# Patient Record
Sex: Male | Born: 1985
Health system: Southern US, Community
[De-identification: ages and names within clinical notes are randomized; demographics above are authoritative.]

---
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
## 2005-10-20 ENCOUNTER — Emergency Department (HOSPITAL_COMMUNITY): Admission: EM | Admit: 2005-10-20 | Discharge: 2005-10-20 | Payer: Self-pay | Admitting: Emergency Medicine

## 2007-11-14 ENCOUNTER — Encounter: Admission: RE | Admit: 2007-11-14 | Discharge: 2007-11-14 | Payer: Self-pay | Admitting: Specialist

## 2009-04-11 ENCOUNTER — Emergency Department (HOSPITAL_COMMUNITY): Admission: EM | Admit: 2009-04-11 | Discharge: 2009-04-11 | Payer: Self-pay | Admitting: Emergency Medicine

## 2010-06-16 ENCOUNTER — Emergency Department (HOSPITAL_COMMUNITY)
Admission: EM | Admit: 2010-06-16 | Discharge: 2010-06-16 | Disposition: A | Discharge status: Home / Self Care | Payer: BC Managed Care – PPO | Attending: Emergency Medicine | Admitting: Emergency Medicine

## 2010-06-16 ENCOUNTER — Emergency Department (HOSPITAL_COMMUNITY): Payer: BC Managed Care – PPO

## 2010-06-16 DIAGNOSIS — R51 Headache: Secondary | ICD-10-CM | POA: Insufficient documentation

## 2010-06-16 DIAGNOSIS — R4789 Other speech disturbances: Secondary | ICD-10-CM | POA: Insufficient documentation

## 2010-06-16 DIAGNOSIS — R209 Unspecified disturbances of skin sensation: Secondary | ICD-10-CM | POA: Insufficient documentation

## 2010-07-18 IMAGING — RF Imaging study
1 series · 1 of 1 positions shown · IV contrast (magnevist)
Comparison: none

CLINICAL DATA: I left shoulder pain status post multiple
dislocations.

Fluoroscopy Time: 23 seconds
LEFT SHOULDER INJECTION UNDER FLUOROSCOPY
TECHNIQUE: An appropriate skin entrance site was determined. The
site was marked, prepped with Betadine, draped in the usual sterile
fashion, and infiltrated locally with buffered Lidocaine.  22 gauge
spinal needle was advanced to the superomedial margin of the
humeral head under intermittent fluoroscopy.  1 ml of Lidocaine
injected easily.  A mixture of 0.1 ml Magnevist 10 ml of dilute
Hary Eko 60 was then used to opacify the left shoulder capsule.  No
immediate complication.

[Series 1: (hospital) · 1 of 1 slices shown]
[im 1/1]
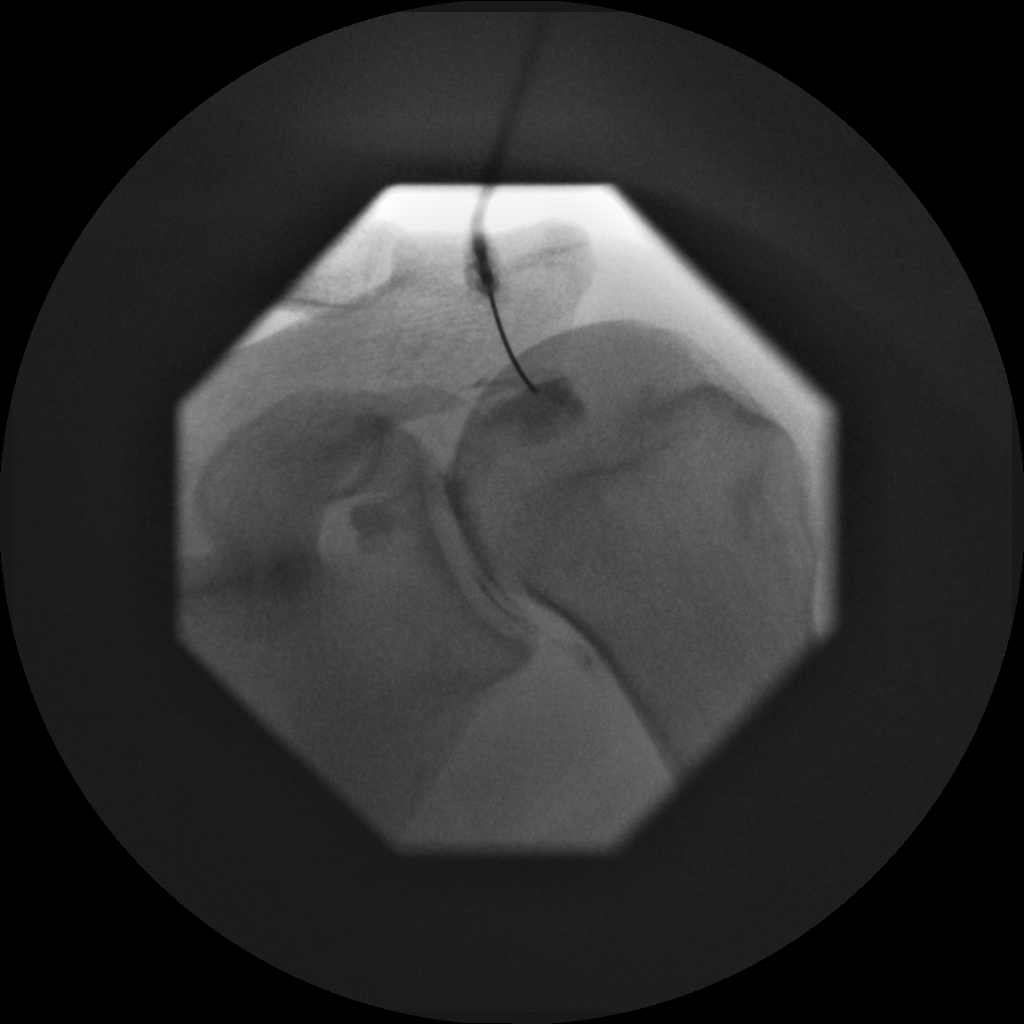

[1 of 1 positions shown; findings below may reference images not displayed]

IMPRESSION: Technically successful left shoulder injection for MRI.

## 2011-12-14 IMAGING — CR DG KNEE 1-2V PORT*L*
2 series · 2 of 2 positions shown · non-contrast
Comparison: None.

CLINICAL DATA: Knee pain.  Deformity.

PORTABLE LEFT KNEE - 1-2 VIEW

[ap/obl knee (1 of 2)]
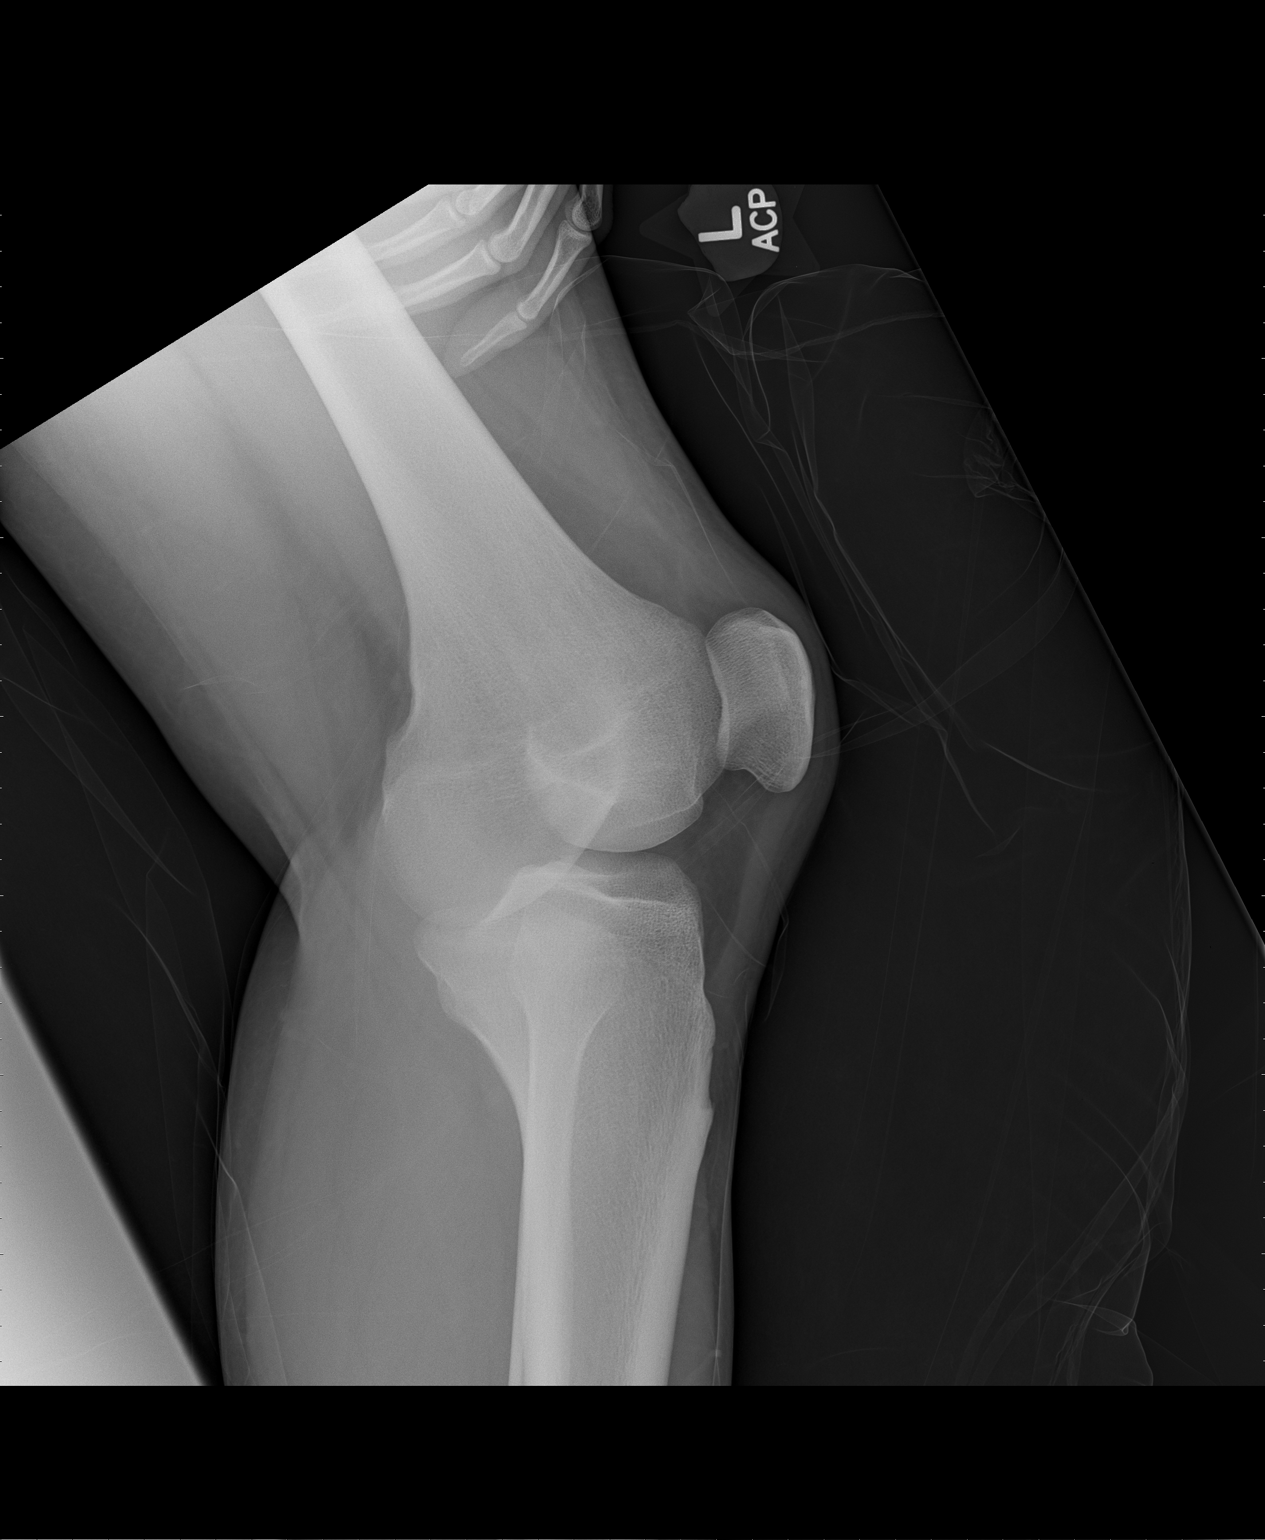

[ap/obl knee (2 of 2)]
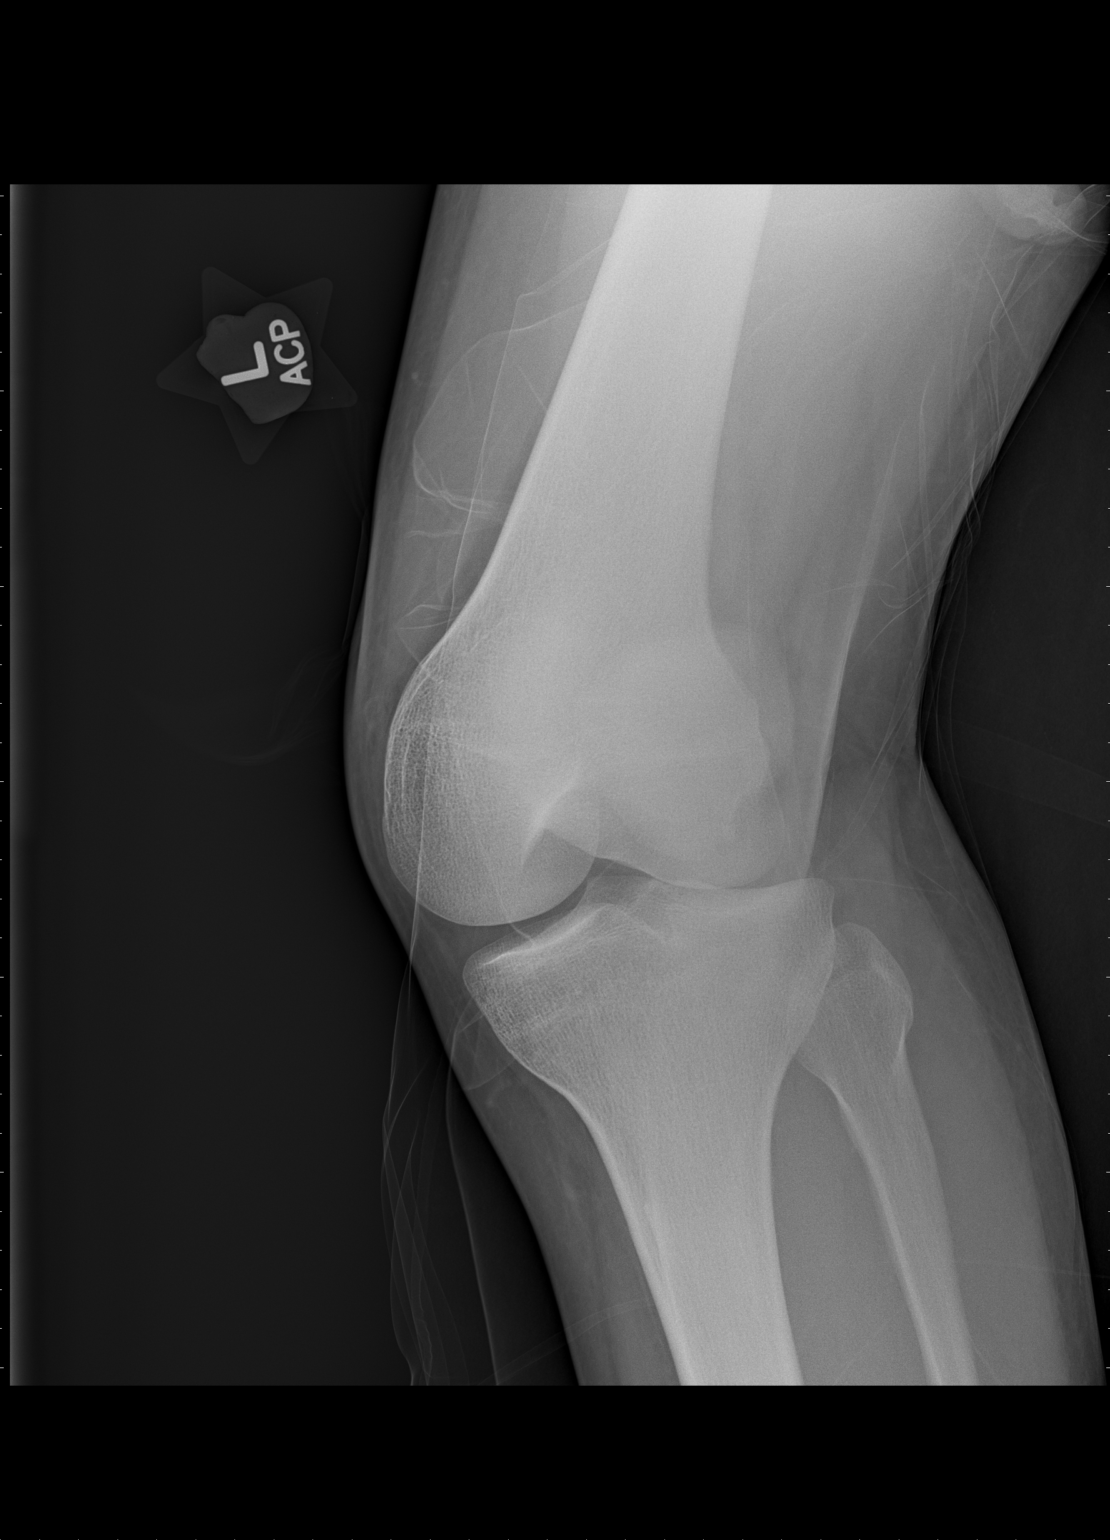

[2 of 2 positions shown; findings below may reference images not displayed]

FINDINGS: Nonstandard views are submitted for interpretation.  The
knee appears located.  No fracture is identified.  Probable small
effusion.  Valgus deformity of the knee is present.  If the patient
can tolerate, repeat examination and standard AP and lateral
projections may prove useful.
IMPRESSION: Nonstandard projections with valgus deformity of the knee.  No
fracture is identified.  Probable small effusion.

## 2015-10-20 DIAGNOSIS — M25521 Pain in right elbow: Secondary | ICD-10-CM | POA: Diagnosis not present

## 2015-10-20 DIAGNOSIS — M9907 Segmental and somatic dysfunction of upper extremity: Secondary | ICD-10-CM | POA: Diagnosis not present

## 2015-10-20 DIAGNOSIS — M542 Cervicalgia: Secondary | ICD-10-CM | POA: Diagnosis not present

## 2015-10-20 DIAGNOSIS — M530 Cervicocranial syndrome: Secondary | ICD-10-CM | POA: Diagnosis not present

## 2015-11-16 DIAGNOSIS — M25521 Pain in right elbow: Secondary | ICD-10-CM | POA: Diagnosis not present

## 2015-11-16 DIAGNOSIS — M9907 Segmental and somatic dysfunction of upper extremity: Secondary | ICD-10-CM | POA: Diagnosis not present

## 2015-11-16 DIAGNOSIS — M530 Cervicocranial syndrome: Secondary | ICD-10-CM | POA: Diagnosis not present

## 2015-11-16 DIAGNOSIS — M542 Cervicalgia: Secondary | ICD-10-CM | POA: Diagnosis not present

## 2015-12-16 DIAGNOSIS — M9907 Segmental and somatic dysfunction of upper extremity: Secondary | ICD-10-CM | POA: Diagnosis not present

## 2015-12-16 DIAGNOSIS — M25521 Pain in right elbow: Secondary | ICD-10-CM | POA: Diagnosis not present

## 2015-12-16 DIAGNOSIS — M530 Cervicocranial syndrome: Secondary | ICD-10-CM | POA: Diagnosis not present

## 2015-12-16 DIAGNOSIS — M542 Cervicalgia: Secondary | ICD-10-CM | POA: Diagnosis not present

## 2016-01-18 DIAGNOSIS — M25521 Pain in right elbow: Secondary | ICD-10-CM | POA: Diagnosis not present

## 2016-01-18 DIAGNOSIS — M9907 Segmental and somatic dysfunction of upper extremity: Secondary | ICD-10-CM | POA: Diagnosis not present

## 2016-01-18 DIAGNOSIS — M530 Cervicocranial syndrome: Secondary | ICD-10-CM | POA: Diagnosis not present

## 2016-01-18 DIAGNOSIS — M542 Cervicalgia: Secondary | ICD-10-CM | POA: Diagnosis not present

## 2016-01-28 DIAGNOSIS — M25521 Pain in right elbow: Secondary | ICD-10-CM | POA: Diagnosis not present

## 2016-01-28 DIAGNOSIS — M530 Cervicocranial syndrome: Secondary | ICD-10-CM | POA: Diagnosis not present

## 2016-01-28 DIAGNOSIS — M542 Cervicalgia: Secondary | ICD-10-CM | POA: Diagnosis not present

## 2016-01-28 DIAGNOSIS — M9907 Segmental and somatic dysfunction of upper extremity: Secondary | ICD-10-CM | POA: Diagnosis not present

## 2016-02-18 DIAGNOSIS — M530 Cervicocranial syndrome: Secondary | ICD-10-CM | POA: Diagnosis not present

## 2016-02-18 DIAGNOSIS — M9907 Segmental and somatic dysfunction of upper extremity: Secondary | ICD-10-CM | POA: Diagnosis not present

## 2016-02-18 DIAGNOSIS — M25521 Pain in right elbow: Secondary | ICD-10-CM | POA: Diagnosis not present

## 2016-02-18 DIAGNOSIS — M542 Cervicalgia: Secondary | ICD-10-CM | POA: Diagnosis not present

## 2016-03-18 DIAGNOSIS — M9907 Segmental and somatic dysfunction of upper extremity: Secondary | ICD-10-CM | POA: Diagnosis not present

## 2016-03-18 DIAGNOSIS — M530 Cervicocranial syndrome: Secondary | ICD-10-CM | POA: Diagnosis not present

## 2016-03-18 DIAGNOSIS — M25521 Pain in right elbow: Secondary | ICD-10-CM | POA: Diagnosis not present

## 2016-03-18 DIAGNOSIS — M542 Cervicalgia: Secondary | ICD-10-CM | POA: Diagnosis not present

## 2016-04-20 DIAGNOSIS — M9907 Segmental and somatic dysfunction of upper extremity: Secondary | ICD-10-CM | POA: Diagnosis not present

## 2016-04-20 DIAGNOSIS — M530 Cervicocranial syndrome: Secondary | ICD-10-CM | POA: Diagnosis not present

## 2016-04-20 DIAGNOSIS — M542 Cervicalgia: Secondary | ICD-10-CM | POA: Diagnosis not present

## 2016-04-20 DIAGNOSIS — M25521 Pain in right elbow: Secondary | ICD-10-CM | POA: Diagnosis not present

## 2016-05-18 DIAGNOSIS — M9907 Segmental and somatic dysfunction of upper extremity: Secondary | ICD-10-CM | POA: Diagnosis not present

## 2016-05-18 DIAGNOSIS — M530 Cervicocranial syndrome: Secondary | ICD-10-CM | POA: Diagnosis not present

## 2016-05-18 DIAGNOSIS — M542 Cervicalgia: Secondary | ICD-10-CM | POA: Diagnosis not present

## 2016-05-18 DIAGNOSIS — M25521 Pain in right elbow: Secondary | ICD-10-CM | POA: Diagnosis not present

## 2016-06-17 DIAGNOSIS — M530 Cervicocranial syndrome: Secondary | ICD-10-CM | POA: Diagnosis not present

## 2016-06-17 DIAGNOSIS — M9907 Segmental and somatic dysfunction of upper extremity: Secondary | ICD-10-CM | POA: Diagnosis not present

## 2016-06-17 DIAGNOSIS — M25521 Pain in right elbow: Secondary | ICD-10-CM | POA: Diagnosis not present

## 2016-06-17 DIAGNOSIS — M542 Cervicalgia: Secondary | ICD-10-CM | POA: Diagnosis not present

## 2016-07-15 DIAGNOSIS — M530 Cervicocranial syndrome: Secondary | ICD-10-CM | POA: Diagnosis not present

## 2016-07-15 DIAGNOSIS — M542 Cervicalgia: Secondary | ICD-10-CM | POA: Diagnosis not present

## 2016-07-15 DIAGNOSIS — M25521 Pain in right elbow: Secondary | ICD-10-CM | POA: Diagnosis not present

## 2016-07-15 DIAGNOSIS — M9907 Segmental and somatic dysfunction of upper extremity: Secondary | ICD-10-CM | POA: Diagnosis not present

## 2016-07-28 DIAGNOSIS — M530 Cervicocranial syndrome: Secondary | ICD-10-CM | POA: Diagnosis not present

## 2016-07-28 DIAGNOSIS — M9907 Segmental and somatic dysfunction of upper extremity: Secondary | ICD-10-CM | POA: Diagnosis not present

## 2016-07-28 DIAGNOSIS — M542 Cervicalgia: Secondary | ICD-10-CM | POA: Diagnosis not present

## 2016-07-28 DIAGNOSIS — M25521 Pain in right elbow: Secondary | ICD-10-CM | POA: Diagnosis not present

## 2016-08-22 DIAGNOSIS — M25521 Pain in right elbow: Secondary | ICD-10-CM | POA: Diagnosis not present

## 2016-08-22 DIAGNOSIS — M530 Cervicocranial syndrome: Secondary | ICD-10-CM | POA: Diagnosis not present

## 2016-08-22 DIAGNOSIS — M542 Cervicalgia: Secondary | ICD-10-CM | POA: Diagnosis not present

## 2016-08-22 DIAGNOSIS — M9907 Segmental and somatic dysfunction of upper extremity: Secondary | ICD-10-CM | POA: Diagnosis not present

## 2016-09-09 DIAGNOSIS — M79661 Pain in right lower leg: Secondary | ICD-10-CM | POA: Diagnosis not present

## 2016-09-09 DIAGNOSIS — W57XXXA Bitten or stung by nonvenomous insect and other nonvenomous arthropods, initial encounter: Secondary | ICD-10-CM | POA: Diagnosis not present

## 2016-09-09 DIAGNOSIS — K5900 Constipation, unspecified: Secondary | ICD-10-CM | POA: Diagnosis not present

## 2016-09-13 DIAGNOSIS — M9902 Segmental and somatic dysfunction of thoracic region: Secondary | ICD-10-CM | POA: Diagnosis not present

## 2016-09-13 DIAGNOSIS — M9903 Segmental and somatic dysfunction of lumbar region: Secondary | ICD-10-CM | POA: Diagnosis not present

## 2016-09-13 DIAGNOSIS — M5386 Other specified dorsopathies, lumbar region: Secondary | ICD-10-CM | POA: Diagnosis not present

## 2016-09-13 DIAGNOSIS — M9905 Segmental and somatic dysfunction of pelvic region: Secondary | ICD-10-CM | POA: Diagnosis not present

## 2016-09-14 DIAGNOSIS — M9903 Segmental and somatic dysfunction of lumbar region: Secondary | ICD-10-CM | POA: Diagnosis not present

## 2016-09-14 DIAGNOSIS — M5417 Radiculopathy, lumbosacral region: Secondary | ICD-10-CM | POA: Diagnosis not present

## 2016-09-14 DIAGNOSIS — M9901 Segmental and somatic dysfunction of cervical region: Secondary | ICD-10-CM | POA: Diagnosis not present

## 2016-09-14 DIAGNOSIS — M9904 Segmental and somatic dysfunction of sacral region: Secondary | ICD-10-CM | POA: Diagnosis not present

## 2016-09-16 DIAGNOSIS — M9904 Segmental and somatic dysfunction of sacral region: Secondary | ICD-10-CM | POA: Diagnosis not present

## 2016-09-16 DIAGNOSIS — M9903 Segmental and somatic dysfunction of lumbar region: Secondary | ICD-10-CM | POA: Diagnosis not present

## 2016-09-16 DIAGNOSIS — M5417 Radiculopathy, lumbosacral region: Secondary | ICD-10-CM | POA: Diagnosis not present

## 2016-09-16 DIAGNOSIS — M9901 Segmental and somatic dysfunction of cervical region: Secondary | ICD-10-CM | POA: Diagnosis not present

## 2016-09-20 DIAGNOSIS — M9901 Segmental and somatic dysfunction of cervical region: Secondary | ICD-10-CM | POA: Diagnosis not present

## 2016-09-20 DIAGNOSIS — M9903 Segmental and somatic dysfunction of lumbar region: Secondary | ICD-10-CM | POA: Diagnosis not present

## 2016-09-20 DIAGNOSIS — M5417 Radiculopathy, lumbosacral region: Secondary | ICD-10-CM | POA: Diagnosis not present

## 2016-09-20 DIAGNOSIS — M9904 Segmental and somatic dysfunction of sacral region: Secondary | ICD-10-CM | POA: Diagnosis not present

## 2016-09-23 DIAGNOSIS — M5417 Radiculopathy, lumbosacral region: Secondary | ICD-10-CM | POA: Diagnosis not present

## 2016-09-23 DIAGNOSIS — M9904 Segmental and somatic dysfunction of sacral region: Secondary | ICD-10-CM | POA: Diagnosis not present

## 2016-09-23 DIAGNOSIS — M9903 Segmental and somatic dysfunction of lumbar region: Secondary | ICD-10-CM | POA: Diagnosis not present

## 2016-09-23 DIAGNOSIS — M9901 Segmental and somatic dysfunction of cervical region: Secondary | ICD-10-CM | POA: Diagnosis not present

## 2016-09-27 DIAGNOSIS — M9901 Segmental and somatic dysfunction of cervical region: Secondary | ICD-10-CM | POA: Diagnosis not present

## 2016-09-27 DIAGNOSIS — M9903 Segmental and somatic dysfunction of lumbar region: Secondary | ICD-10-CM | POA: Diagnosis not present

## 2016-09-27 DIAGNOSIS — M9904 Segmental and somatic dysfunction of sacral region: Secondary | ICD-10-CM | POA: Diagnosis not present

## 2016-09-27 DIAGNOSIS — M5417 Radiculopathy, lumbosacral region: Secondary | ICD-10-CM | POA: Diagnosis not present

## 2016-09-29 DIAGNOSIS — M9903 Segmental and somatic dysfunction of lumbar region: Secondary | ICD-10-CM | POA: Diagnosis not present

## 2016-09-29 DIAGNOSIS — M5417 Radiculopathy, lumbosacral region: Secondary | ICD-10-CM | POA: Diagnosis not present

## 2016-09-29 DIAGNOSIS — M9901 Segmental and somatic dysfunction of cervical region: Secondary | ICD-10-CM | POA: Diagnosis not present

## 2016-09-29 DIAGNOSIS — M9904 Segmental and somatic dysfunction of sacral region: Secondary | ICD-10-CM | POA: Diagnosis not present

## 2016-10-03 DIAGNOSIS — M5417 Radiculopathy, lumbosacral region: Secondary | ICD-10-CM | POA: Diagnosis not present

## 2016-10-03 DIAGNOSIS — M9903 Segmental and somatic dysfunction of lumbar region: Secondary | ICD-10-CM | POA: Diagnosis not present

## 2016-10-03 DIAGNOSIS — M9904 Segmental and somatic dysfunction of sacral region: Secondary | ICD-10-CM | POA: Diagnosis not present

## 2016-10-03 DIAGNOSIS — M9901 Segmental and somatic dysfunction of cervical region: Secondary | ICD-10-CM | POA: Diagnosis not present

## 2016-10-05 DIAGNOSIS — M9901 Segmental and somatic dysfunction of cervical region: Secondary | ICD-10-CM | POA: Diagnosis not present

## 2016-10-05 DIAGNOSIS — M9904 Segmental and somatic dysfunction of sacral region: Secondary | ICD-10-CM | POA: Diagnosis not present

## 2016-10-05 DIAGNOSIS — M5417 Radiculopathy, lumbosacral region: Secondary | ICD-10-CM | POA: Diagnosis not present

## 2016-10-05 DIAGNOSIS — M9903 Segmental and somatic dysfunction of lumbar region: Secondary | ICD-10-CM | POA: Diagnosis not present

## 2016-10-10 DIAGNOSIS — M9901 Segmental and somatic dysfunction of cervical region: Secondary | ICD-10-CM | POA: Diagnosis not present

## 2016-10-10 DIAGNOSIS — M5417 Radiculopathy, lumbosacral region: Secondary | ICD-10-CM | POA: Diagnosis not present

## 2016-10-10 DIAGNOSIS — M9903 Segmental and somatic dysfunction of lumbar region: Secondary | ICD-10-CM | POA: Diagnosis not present

## 2016-10-10 DIAGNOSIS — M9904 Segmental and somatic dysfunction of sacral region: Secondary | ICD-10-CM | POA: Diagnosis not present

## 2016-10-11 DIAGNOSIS — M9903 Segmental and somatic dysfunction of lumbar region: Secondary | ICD-10-CM | POA: Diagnosis not present

## 2016-10-11 DIAGNOSIS — M9904 Segmental and somatic dysfunction of sacral region: Secondary | ICD-10-CM | POA: Diagnosis not present

## 2016-10-11 DIAGNOSIS — M5417 Radiculopathy, lumbosacral region: Secondary | ICD-10-CM | POA: Diagnosis not present

## 2016-10-11 DIAGNOSIS — M9901 Segmental and somatic dysfunction of cervical region: Secondary | ICD-10-CM | POA: Diagnosis not present

## 2016-10-18 DIAGNOSIS — M5417 Radiculopathy, lumbosacral region: Secondary | ICD-10-CM | POA: Diagnosis not present

## 2016-10-18 DIAGNOSIS — M9901 Segmental and somatic dysfunction of cervical region: Secondary | ICD-10-CM | POA: Diagnosis not present

## 2016-10-18 DIAGNOSIS — M9903 Segmental and somatic dysfunction of lumbar region: Secondary | ICD-10-CM | POA: Diagnosis not present

## 2016-10-18 DIAGNOSIS — M9904 Segmental and somatic dysfunction of sacral region: Secondary | ICD-10-CM | POA: Diagnosis not present

## 2016-11-03 DIAGNOSIS — M9903 Segmental and somatic dysfunction of lumbar region: Secondary | ICD-10-CM | POA: Diagnosis not present

## 2016-11-03 DIAGNOSIS — M9904 Segmental and somatic dysfunction of sacral region: Secondary | ICD-10-CM | POA: Diagnosis not present

## 2016-11-03 DIAGNOSIS — M5417 Radiculopathy, lumbosacral region: Secondary | ICD-10-CM | POA: Diagnosis not present

## 2016-11-03 DIAGNOSIS — M9901 Segmental and somatic dysfunction of cervical region: Secondary | ICD-10-CM | POA: Diagnosis not present

## 2016-12-01 DIAGNOSIS — M9903 Segmental and somatic dysfunction of lumbar region: Secondary | ICD-10-CM | POA: Diagnosis not present

## 2016-12-01 DIAGNOSIS — M9901 Segmental and somatic dysfunction of cervical region: Secondary | ICD-10-CM | POA: Diagnosis not present

## 2016-12-01 DIAGNOSIS — M9904 Segmental and somatic dysfunction of sacral region: Secondary | ICD-10-CM | POA: Diagnosis not present

## 2016-12-01 DIAGNOSIS — M5417 Radiculopathy, lumbosacral region: Secondary | ICD-10-CM | POA: Diagnosis not present

## 2016-12-29 DIAGNOSIS — M5417 Radiculopathy, lumbosacral region: Secondary | ICD-10-CM | POA: Diagnosis not present

## 2016-12-29 DIAGNOSIS — M9903 Segmental and somatic dysfunction of lumbar region: Secondary | ICD-10-CM | POA: Diagnosis not present

## 2016-12-29 DIAGNOSIS — M9904 Segmental and somatic dysfunction of sacral region: Secondary | ICD-10-CM | POA: Diagnosis not present

## 2016-12-29 DIAGNOSIS — M9901 Segmental and somatic dysfunction of cervical region: Secondary | ICD-10-CM | POA: Diagnosis not present

## 2017-01-25 DIAGNOSIS — M9901 Segmental and somatic dysfunction of cervical region: Secondary | ICD-10-CM | POA: Diagnosis not present

## 2017-01-25 DIAGNOSIS — M9903 Segmental and somatic dysfunction of lumbar region: Secondary | ICD-10-CM | POA: Diagnosis not present

## 2017-01-25 DIAGNOSIS — M5417 Radiculopathy, lumbosacral region: Secondary | ICD-10-CM | POA: Diagnosis not present

## 2017-01-25 DIAGNOSIS — M9904 Segmental and somatic dysfunction of sacral region: Secondary | ICD-10-CM | POA: Diagnosis not present

## 2017-02-18 DIAGNOSIS — J029 Acute pharyngitis, unspecified: Secondary | ICD-10-CM | POA: Diagnosis not present

## 2017-02-18 DIAGNOSIS — Z6825 Body mass index (BMI) 25.0-25.9, adult: Secondary | ICD-10-CM | POA: Diagnosis not present

## 2017-03-06 DIAGNOSIS — M9901 Segmental and somatic dysfunction of cervical region: Secondary | ICD-10-CM | POA: Diagnosis not present

## 2017-03-06 DIAGNOSIS — M9904 Segmental and somatic dysfunction of sacral region: Secondary | ICD-10-CM | POA: Diagnosis not present

## 2017-03-06 DIAGNOSIS — M9903 Segmental and somatic dysfunction of lumbar region: Secondary | ICD-10-CM | POA: Diagnosis not present

## 2017-03-06 DIAGNOSIS — M5417 Radiculopathy, lumbosacral region: Secondary | ICD-10-CM | POA: Diagnosis not present

## 2017-03-30 DIAGNOSIS — M9904 Segmental and somatic dysfunction of sacral region: Secondary | ICD-10-CM | POA: Diagnosis not present

## 2017-03-30 DIAGNOSIS — M9903 Segmental and somatic dysfunction of lumbar region: Secondary | ICD-10-CM | POA: Diagnosis not present

## 2017-03-30 DIAGNOSIS — M9901 Segmental and somatic dysfunction of cervical region: Secondary | ICD-10-CM | POA: Diagnosis not present

## 2017-03-30 DIAGNOSIS — M5417 Radiculopathy, lumbosacral region: Secondary | ICD-10-CM | POA: Diagnosis not present

## 2017-05-01 DIAGNOSIS — Z6826 Body mass index (BMI) 26.0-26.9, adult: Secondary | ICD-10-CM | POA: Diagnosis not present

## 2017-05-01 DIAGNOSIS — J209 Acute bronchitis, unspecified: Secondary | ICD-10-CM | POA: Diagnosis not present

## 2017-05-01 DIAGNOSIS — M9903 Segmental and somatic dysfunction of lumbar region: Secondary | ICD-10-CM | POA: Diagnosis not present

## 2017-05-01 DIAGNOSIS — M9904 Segmental and somatic dysfunction of sacral region: Secondary | ICD-10-CM | POA: Diagnosis not present

## 2017-05-01 DIAGNOSIS — M9901 Segmental and somatic dysfunction of cervical region: Secondary | ICD-10-CM | POA: Diagnosis not present

## 2017-05-01 DIAGNOSIS — M5417 Radiculopathy, lumbosacral region: Secondary | ICD-10-CM | POA: Diagnosis not present

## 2017-05-29 DIAGNOSIS — M9901 Segmental and somatic dysfunction of cervical region: Secondary | ICD-10-CM | POA: Diagnosis not present

## 2017-05-29 DIAGNOSIS — M9903 Segmental and somatic dysfunction of lumbar region: Secondary | ICD-10-CM | POA: Diagnosis not present

## 2017-05-29 DIAGNOSIS — M9904 Segmental and somatic dysfunction of sacral region: Secondary | ICD-10-CM | POA: Diagnosis not present

## 2017-05-29 DIAGNOSIS — M5417 Radiculopathy, lumbosacral region: Secondary | ICD-10-CM | POA: Diagnosis not present

## 2017-06-29 DIAGNOSIS — M5417 Radiculopathy, lumbosacral region: Secondary | ICD-10-CM | POA: Diagnosis not present

## 2017-06-29 DIAGNOSIS — M9904 Segmental and somatic dysfunction of sacral region: Secondary | ICD-10-CM | POA: Diagnosis not present

## 2017-06-29 DIAGNOSIS — M9901 Segmental and somatic dysfunction of cervical region: Secondary | ICD-10-CM | POA: Diagnosis not present

## 2017-06-29 DIAGNOSIS — M9903 Segmental and somatic dysfunction of lumbar region: Secondary | ICD-10-CM | POA: Diagnosis not present

## 2017-07-31 DIAGNOSIS — M9901 Segmental and somatic dysfunction of cervical region: Secondary | ICD-10-CM | POA: Diagnosis not present

## 2017-07-31 DIAGNOSIS — M5417 Radiculopathy, lumbosacral region: Secondary | ICD-10-CM | POA: Diagnosis not present

## 2017-07-31 DIAGNOSIS — M9903 Segmental and somatic dysfunction of lumbar region: Secondary | ICD-10-CM | POA: Diagnosis not present

## 2017-07-31 DIAGNOSIS — M9904 Segmental and somatic dysfunction of sacral region: Secondary | ICD-10-CM | POA: Diagnosis not present

## 2017-08-28 DIAGNOSIS — M9903 Segmental and somatic dysfunction of lumbar region: Secondary | ICD-10-CM | POA: Diagnosis not present

## 2017-08-28 DIAGNOSIS — M5417 Radiculopathy, lumbosacral region: Secondary | ICD-10-CM | POA: Diagnosis not present

## 2017-08-28 DIAGNOSIS — M9901 Segmental and somatic dysfunction of cervical region: Secondary | ICD-10-CM | POA: Diagnosis not present

## 2017-08-28 DIAGNOSIS — M9904 Segmental and somatic dysfunction of sacral region: Secondary | ICD-10-CM | POA: Diagnosis not present

## 2017-09-06 DIAGNOSIS — R5383 Other fatigue: Secondary | ICD-10-CM | POA: Diagnosis not present

## 2017-09-06 DIAGNOSIS — R5381 Other malaise: Secondary | ICD-10-CM | POA: Diagnosis not present

## 2017-09-06 DIAGNOSIS — Z0000 Encounter for general adult medical examination without abnormal findings: Secondary | ICD-10-CM | POA: Diagnosis not present

## 2017-09-06 DIAGNOSIS — Z6827 Body mass index (BMI) 27.0-27.9, adult: Secondary | ICD-10-CM | POA: Diagnosis not present

## 2017-09-08 DIAGNOSIS — H6123 Impacted cerumen, bilateral: Secondary | ICD-10-CM | POA: Diagnosis not present

## 2017-09-08 DIAGNOSIS — H6091 Unspecified otitis externa, right ear: Secondary | ICD-10-CM | POA: Diagnosis not present

## 2017-09-13 DIAGNOSIS — E559 Vitamin D deficiency, unspecified: Secondary | ICD-10-CM | POA: Diagnosis not present

## 2017-09-13 DIAGNOSIS — R5383 Other fatigue: Secondary | ICD-10-CM | POA: Diagnosis not present

## 2017-09-13 DIAGNOSIS — R5381 Other malaise: Secondary | ICD-10-CM | POA: Diagnosis not present

## 2017-09-13 DIAGNOSIS — Z0000 Encounter for general adult medical examination without abnormal findings: Secondary | ICD-10-CM | POA: Diagnosis not present

## 2017-09-20 DIAGNOSIS — E291 Testicular hypofunction: Secondary | ICD-10-CM | POA: Diagnosis not present

## 2017-09-26 DIAGNOSIS — M5417 Radiculopathy, lumbosacral region: Secondary | ICD-10-CM | POA: Diagnosis not present

## 2017-09-26 DIAGNOSIS — M9903 Segmental and somatic dysfunction of lumbar region: Secondary | ICD-10-CM | POA: Diagnosis not present

## 2017-09-26 DIAGNOSIS — M9901 Segmental and somatic dysfunction of cervical region: Secondary | ICD-10-CM | POA: Diagnosis not present

## 2017-09-26 DIAGNOSIS — M9904 Segmental and somatic dysfunction of sacral region: Secondary | ICD-10-CM | POA: Diagnosis not present

## 2017-10-24 DIAGNOSIS — M9901 Segmental and somatic dysfunction of cervical region: Secondary | ICD-10-CM | POA: Diagnosis not present

## 2017-10-24 DIAGNOSIS — M530 Cervicocranial syndrome: Secondary | ICD-10-CM | POA: Diagnosis not present

## 2017-10-24 DIAGNOSIS — M9903 Segmental and somatic dysfunction of lumbar region: Secondary | ICD-10-CM | POA: Diagnosis not present

## 2017-10-24 DIAGNOSIS — M9902 Segmental and somatic dysfunction of thoracic region: Secondary | ICD-10-CM | POA: Diagnosis not present

## 2017-11-10 DIAGNOSIS — M9903 Segmental and somatic dysfunction of lumbar region: Secondary | ICD-10-CM | POA: Diagnosis not present

## 2017-11-10 DIAGNOSIS — M9901 Segmental and somatic dysfunction of cervical region: Secondary | ICD-10-CM | POA: Diagnosis not present

## 2017-11-10 DIAGNOSIS — M9902 Segmental and somatic dysfunction of thoracic region: Secondary | ICD-10-CM | POA: Diagnosis not present

## 2017-11-10 DIAGNOSIS — M530 Cervicocranial syndrome: Secondary | ICD-10-CM | POA: Diagnosis not present

## 2017-12-08 DIAGNOSIS — M9903 Segmental and somatic dysfunction of lumbar region: Secondary | ICD-10-CM | POA: Diagnosis not present

## 2017-12-08 DIAGNOSIS — M530 Cervicocranial syndrome: Secondary | ICD-10-CM | POA: Diagnosis not present

## 2017-12-08 DIAGNOSIS — M9902 Segmental and somatic dysfunction of thoracic region: Secondary | ICD-10-CM | POA: Diagnosis not present

## 2017-12-08 DIAGNOSIS — M9901 Segmental and somatic dysfunction of cervical region: Secondary | ICD-10-CM | POA: Diagnosis not present

## 2018-01-18 DIAGNOSIS — M9901 Segmental and somatic dysfunction of cervical region: Secondary | ICD-10-CM | POA: Diagnosis not present

## 2018-01-18 DIAGNOSIS — M530 Cervicocranial syndrome: Secondary | ICD-10-CM | POA: Diagnosis not present

## 2018-01-18 DIAGNOSIS — M9903 Segmental and somatic dysfunction of lumbar region: Secondary | ICD-10-CM | POA: Diagnosis not present

## 2018-01-18 DIAGNOSIS — M9902 Segmental and somatic dysfunction of thoracic region: Secondary | ICD-10-CM | POA: Diagnosis not present

## 2018-02-16 DIAGNOSIS — M9902 Segmental and somatic dysfunction of thoracic region: Secondary | ICD-10-CM | POA: Diagnosis not present

## 2018-02-16 DIAGNOSIS — M9903 Segmental and somatic dysfunction of lumbar region: Secondary | ICD-10-CM | POA: Diagnosis not present

## 2018-02-16 DIAGNOSIS — M9901 Segmental and somatic dysfunction of cervical region: Secondary | ICD-10-CM | POA: Diagnosis not present

## 2018-02-16 DIAGNOSIS — M530 Cervicocranial syndrome: Secondary | ICD-10-CM | POA: Diagnosis not present

## 2018-03-15 DIAGNOSIS — M530 Cervicocranial syndrome: Secondary | ICD-10-CM | POA: Diagnosis not present

## 2018-03-15 DIAGNOSIS — M9901 Segmental and somatic dysfunction of cervical region: Secondary | ICD-10-CM | POA: Diagnosis not present

## 2018-03-15 DIAGNOSIS — M9903 Segmental and somatic dysfunction of lumbar region: Secondary | ICD-10-CM | POA: Diagnosis not present

## 2018-03-15 DIAGNOSIS — M9902 Segmental and somatic dysfunction of thoracic region: Secondary | ICD-10-CM | POA: Diagnosis not present

## 2018-04-16 DIAGNOSIS — M9902 Segmental and somatic dysfunction of thoracic region: Secondary | ICD-10-CM | POA: Diagnosis not present

## 2018-04-16 DIAGNOSIS — M530 Cervicocranial syndrome: Secondary | ICD-10-CM | POA: Diagnosis not present

## 2018-04-16 DIAGNOSIS — M9903 Segmental and somatic dysfunction of lumbar region: Secondary | ICD-10-CM | POA: Diagnosis not present

## 2018-04-16 DIAGNOSIS — M9901 Segmental and somatic dysfunction of cervical region: Secondary | ICD-10-CM | POA: Diagnosis not present

## 2018-05-17 DIAGNOSIS — E291 Testicular hypofunction: Secondary | ICD-10-CM | POA: Diagnosis not present

## 2018-05-17 DIAGNOSIS — R0981 Nasal congestion: Secondary | ICD-10-CM | POA: Diagnosis not present

## 2018-05-17 DIAGNOSIS — J029 Acute pharyngitis, unspecified: Secondary | ICD-10-CM | POA: Diagnosis not present

## 2018-05-21 DIAGNOSIS — M9903 Segmental and somatic dysfunction of lumbar region: Secondary | ICD-10-CM | POA: Diagnosis not present

## 2018-05-21 DIAGNOSIS — E782 Mixed hyperlipidemia: Secondary | ICD-10-CM | POA: Diagnosis not present

## 2018-05-21 DIAGNOSIS — M530 Cervicocranial syndrome: Secondary | ICD-10-CM | POA: Diagnosis not present

## 2018-05-21 DIAGNOSIS — Z0000 Encounter for general adult medical examination without abnormal findings: Secondary | ICD-10-CM | POA: Diagnosis not present

## 2018-05-21 DIAGNOSIS — M9901 Segmental and somatic dysfunction of cervical region: Secondary | ICD-10-CM | POA: Diagnosis not present

## 2018-05-21 DIAGNOSIS — R5383 Other fatigue: Secondary | ICD-10-CM | POA: Diagnosis not present

## 2018-05-21 DIAGNOSIS — M9902 Segmental and somatic dysfunction of thoracic region: Secondary | ICD-10-CM | POA: Diagnosis not present

## 2018-05-21 DIAGNOSIS — E559 Vitamin D deficiency, unspecified: Secondary | ICD-10-CM | POA: Diagnosis not present

## 2018-06-18 DIAGNOSIS — R5383 Other fatigue: Secondary | ICD-10-CM | POA: Diagnosis not present

## 2018-06-18 DIAGNOSIS — E559 Vitamin D deficiency, unspecified: Secondary | ICD-10-CM | POA: Diagnosis not present

## 2018-06-18 DIAGNOSIS — M9902 Segmental and somatic dysfunction of thoracic region: Secondary | ICD-10-CM | POA: Diagnosis not present

## 2018-06-18 DIAGNOSIS — M9903 Segmental and somatic dysfunction of lumbar region: Secondary | ICD-10-CM | POA: Diagnosis not present

## 2018-06-18 DIAGNOSIS — M530 Cervicocranial syndrome: Secondary | ICD-10-CM | POA: Diagnosis not present

## 2018-06-18 DIAGNOSIS — E782 Mixed hyperlipidemia: Secondary | ICD-10-CM | POA: Diagnosis not present

## 2018-06-18 DIAGNOSIS — M9901 Segmental and somatic dysfunction of cervical region: Secondary | ICD-10-CM | POA: Diagnosis not present

## 2018-06-18 DIAGNOSIS — Z0000 Encounter for general adult medical examination without abnormal findings: Secondary | ICD-10-CM | POA: Diagnosis not present

## 2018-07-04 ENCOUNTER — Other Ambulatory Visit: Payer: Self-pay | Admitting: Internal Medicine

## 2018-07-04 ENCOUNTER — Ambulatory Visit (HOSPITAL_COMMUNITY)
Admission: RE | Admit: 2018-07-04 | Discharge: 2018-07-04 | Disposition: A | Discharge status: Home / Self Care | Payer: BLUE CROSS/BLUE SHIELD | Source: Ambulatory Visit | Attending: Internal Medicine | Admitting: Internal Medicine

## 2018-07-04 ENCOUNTER — Other Ambulatory Visit: Payer: Self-pay

## 2018-07-04 DIAGNOSIS — R1031 Right lower quadrant pain: Secondary | ICD-10-CM | POA: Diagnosis not present

## 2018-07-04 MED ORDER — IOHEXOL 300 MG/ML  SOLN
100.0000 mL | Freq: Once | INTRAMUSCULAR | Status: AC | PRN
  Administered 2018-07-04: 100 mL via INTRAVENOUS

## 2018-07-24 DIAGNOSIS — M9903 Segmental and somatic dysfunction of lumbar region: Secondary | ICD-10-CM | POA: Diagnosis not present

## 2018-07-24 DIAGNOSIS — M9902 Segmental and somatic dysfunction of thoracic region: Secondary | ICD-10-CM | POA: Diagnosis not present

## 2018-07-24 DIAGNOSIS — M9901 Segmental and somatic dysfunction of cervical region: Secondary | ICD-10-CM | POA: Diagnosis not present

## 2018-08-23 DIAGNOSIS — M9901 Segmental and somatic dysfunction of cervical region: Secondary | ICD-10-CM | POA: Diagnosis not present

## 2018-08-23 DIAGNOSIS — M9903 Segmental and somatic dysfunction of lumbar region: Secondary | ICD-10-CM | POA: Diagnosis not present

## 2018-08-23 DIAGNOSIS — M9902 Segmental and somatic dysfunction of thoracic region: Secondary | ICD-10-CM | POA: Diagnosis not present

## 2018-09-20 DIAGNOSIS — M9901 Segmental and somatic dysfunction of cervical region: Secondary | ICD-10-CM | POA: Diagnosis not present

## 2018-09-20 DIAGNOSIS — M9902 Segmental and somatic dysfunction of thoracic region: Secondary | ICD-10-CM | POA: Diagnosis not present

## 2018-09-20 DIAGNOSIS — M9903 Segmental and somatic dysfunction of lumbar region: Secondary | ICD-10-CM | POA: Diagnosis not present

## 2018-10-03 DIAGNOSIS — R5383 Other fatigue: Secondary | ICD-10-CM | POA: Diagnosis not present

## 2018-10-03 DIAGNOSIS — R5381 Other malaise: Secondary | ICD-10-CM | POA: Diagnosis not present

## 2018-10-03 DIAGNOSIS — E559 Vitamin D deficiency, unspecified: Secondary | ICD-10-CM | POA: Diagnosis not present

## 2018-10-03 DIAGNOSIS — E782 Mixed hyperlipidemia: Secondary | ICD-10-CM | POA: Diagnosis not present

## 2018-10-18 DIAGNOSIS — E559 Vitamin D deficiency, unspecified: Secondary | ICD-10-CM | POA: Diagnosis not present

## 2018-10-18 DIAGNOSIS — Z0001 Encounter for general adult medical examination with abnormal findings: Secondary | ICD-10-CM | POA: Diagnosis not present

## 2018-10-18 DIAGNOSIS — E782 Mixed hyperlipidemia: Secondary | ICD-10-CM | POA: Diagnosis not present

## 2018-10-18 DIAGNOSIS — R5381 Other malaise: Secondary | ICD-10-CM | POA: Diagnosis not present

## 2018-10-23 DIAGNOSIS — M9903 Segmental and somatic dysfunction of lumbar region: Secondary | ICD-10-CM | POA: Diagnosis not present

## 2018-10-23 DIAGNOSIS — M9902 Segmental and somatic dysfunction of thoracic region: Secondary | ICD-10-CM | POA: Diagnosis not present

## 2018-10-23 DIAGNOSIS — M9901 Segmental and somatic dysfunction of cervical region: Secondary | ICD-10-CM | POA: Diagnosis not present

## 2018-11-12 DIAGNOSIS — D2262 Melanocytic nevi of left upper limb, including shoulder: Secondary | ICD-10-CM | POA: Diagnosis not present

## 2018-11-12 DIAGNOSIS — D485 Neoplasm of uncertain behavior of skin: Secondary | ICD-10-CM | POA: Diagnosis not present

## 2018-11-12 DIAGNOSIS — D1801 Hemangioma of skin and subcutaneous tissue: Secondary | ICD-10-CM | POA: Diagnosis not present

## 2018-12-12 DIAGNOSIS — M9902 Segmental and somatic dysfunction of thoracic region: Secondary | ICD-10-CM | POA: Diagnosis not present

## 2018-12-12 DIAGNOSIS — M530 Cervicocranial syndrome: Secondary | ICD-10-CM | POA: Diagnosis not present

## 2018-12-12 DIAGNOSIS — M9901 Segmental and somatic dysfunction of cervical region: Secondary | ICD-10-CM | POA: Diagnosis not present

## 2018-12-12 DIAGNOSIS — M9903 Segmental and somatic dysfunction of lumbar region: Secondary | ICD-10-CM | POA: Diagnosis not present

## 2018-12-27 DIAGNOSIS — D751 Secondary polycythemia: Secondary | ICD-10-CM | POA: Diagnosis not present

## 2018-12-27 DIAGNOSIS — E782 Mixed hyperlipidemia: Secondary | ICD-10-CM | POA: Diagnosis not present

## 2018-12-27 DIAGNOSIS — R5381 Other malaise: Secondary | ICD-10-CM | POA: Diagnosis not present

## 2018-12-27 DIAGNOSIS — R5383 Other fatigue: Secondary | ICD-10-CM | POA: Diagnosis not present

## 2019-01-02 DIAGNOSIS — E291 Testicular hypofunction: Secondary | ICD-10-CM | POA: Diagnosis not present

## 2019-01-15 DIAGNOSIS — M9903 Segmental and somatic dysfunction of lumbar region: Secondary | ICD-10-CM | POA: Diagnosis not present

## 2019-01-15 DIAGNOSIS — M9902 Segmental and somatic dysfunction of thoracic region: Secondary | ICD-10-CM | POA: Diagnosis not present

## 2019-01-15 DIAGNOSIS — M9901 Segmental and somatic dysfunction of cervical region: Secondary | ICD-10-CM | POA: Diagnosis not present

## 2019-01-15 DIAGNOSIS — M530 Cervicocranial syndrome: Secondary | ICD-10-CM | POA: Diagnosis not present

## 2019-04-25 DIAGNOSIS — M530 Cervicocranial syndrome: Secondary | ICD-10-CM | POA: Diagnosis not present

## 2019-04-25 DIAGNOSIS — M9901 Segmental and somatic dysfunction of cervical region: Secondary | ICD-10-CM | POA: Diagnosis not present

## 2019-04-25 DIAGNOSIS — M9902 Segmental and somatic dysfunction of thoracic region: Secondary | ICD-10-CM | POA: Diagnosis not present

## 2019-04-25 DIAGNOSIS — M5414 Radiculopathy, thoracic region: Secondary | ICD-10-CM | POA: Diagnosis not present

## 2019-07-12 DIAGNOSIS — M9901 Segmental and somatic dysfunction of cervical region: Secondary | ICD-10-CM | POA: Diagnosis not present

## 2019-07-12 DIAGNOSIS — M530 Cervicocranial syndrome: Secondary | ICD-10-CM | POA: Diagnosis not present

## 2019-07-12 DIAGNOSIS — M5414 Radiculopathy, thoracic region: Secondary | ICD-10-CM | POA: Diagnosis not present

## 2019-07-12 DIAGNOSIS — M9902 Segmental and somatic dysfunction of thoracic region: Secondary | ICD-10-CM | POA: Diagnosis not present

## 2019-08-05 DIAGNOSIS — E559 Vitamin D deficiency, unspecified: Secondary | ICD-10-CM | POA: Diagnosis not present

## 2019-08-05 DIAGNOSIS — D751 Secondary polycythemia: Secondary | ICD-10-CM | POA: Diagnosis not present

## 2019-08-05 DIAGNOSIS — E291 Testicular hypofunction: Secondary | ICD-10-CM | POA: Diagnosis not present

## 2019-08-05 DIAGNOSIS — E782 Mixed hyperlipidemia: Secondary | ICD-10-CM | POA: Diagnosis not present

## 2019-09-19 DIAGNOSIS — M9902 Segmental and somatic dysfunction of thoracic region: Secondary | ICD-10-CM | POA: Diagnosis not present

## 2019-09-19 DIAGNOSIS — M9901 Segmental and somatic dysfunction of cervical region: Secondary | ICD-10-CM | POA: Diagnosis not present

## 2019-09-19 DIAGNOSIS — M5414 Radiculopathy, thoracic region: Secondary | ICD-10-CM | POA: Diagnosis not present

## 2019-09-19 DIAGNOSIS — M530 Cervicocranial syndrome: Secondary | ICD-10-CM | POA: Diagnosis not present

## 2019-10-25 DIAGNOSIS — H6691 Otitis media, unspecified, right ear: Secondary | ICD-10-CM | POA: Diagnosis not present

## 2019-10-25 DIAGNOSIS — Z6824 Body mass index (BMI) 24.0-24.9, adult: Secondary | ICD-10-CM | POA: Diagnosis not present

## 2019-10-25 DIAGNOSIS — R0781 Pleurodynia: Secondary | ICD-10-CM | POA: Diagnosis not present

## 2019-11-01 DIAGNOSIS — M5414 Radiculopathy, thoracic region: Secondary | ICD-10-CM | POA: Diagnosis not present

## 2019-11-01 DIAGNOSIS — M9901 Segmental and somatic dysfunction of cervical region: Secondary | ICD-10-CM | POA: Diagnosis not present

## 2019-11-01 DIAGNOSIS — M9906 Segmental and somatic dysfunction of lower extremity: Secondary | ICD-10-CM | POA: Diagnosis not present

## 2019-11-01 DIAGNOSIS — M530 Cervicocranial syndrome: Secondary | ICD-10-CM | POA: Diagnosis not present

## 2019-11-01 DIAGNOSIS — M9902 Segmental and somatic dysfunction of thoracic region: Secondary | ICD-10-CM | POA: Diagnosis not present

## 2019-11-15 DIAGNOSIS — U071 COVID-19: Secondary | ICD-10-CM | POA: Diagnosis not present

## 2020-01-21 DIAGNOSIS — M9906 Segmental and somatic dysfunction of lower extremity: Secondary | ICD-10-CM | POA: Diagnosis not present

## 2020-01-21 DIAGNOSIS — M9902 Segmental and somatic dysfunction of thoracic region: Secondary | ICD-10-CM | POA: Diagnosis not present

## 2020-01-21 DIAGNOSIS — M530 Cervicocranial syndrome: Secondary | ICD-10-CM | POA: Diagnosis not present

## 2020-01-21 DIAGNOSIS — M9901 Segmental and somatic dysfunction of cervical region: Secondary | ICD-10-CM | POA: Diagnosis not present

## 2020-01-21 DIAGNOSIS — M5414 Radiculopathy, thoracic region: Secondary | ICD-10-CM | POA: Diagnosis not present

## 2020-05-20 DIAGNOSIS — M531 Cervicobrachial syndrome: Secondary | ICD-10-CM | POA: Diagnosis not present

## 2020-05-20 DIAGNOSIS — M9901 Segmental and somatic dysfunction of cervical region: Secondary | ICD-10-CM | POA: Diagnosis not present

## 2020-05-20 DIAGNOSIS — M5414 Radiculopathy, thoracic region: Secondary | ICD-10-CM | POA: Diagnosis not present

## 2020-05-20 DIAGNOSIS — M9902 Segmental and somatic dysfunction of thoracic region: Secondary | ICD-10-CM | POA: Diagnosis not present

## 2020-07-15 DIAGNOSIS — E291 Testicular hypofunction: Secondary | ICD-10-CM | POA: Diagnosis not present

## 2020-07-15 DIAGNOSIS — J069 Acute upper respiratory infection, unspecified: Secondary | ICD-10-CM | POA: Diagnosis not present

## 2020-07-20 DIAGNOSIS — H918X1 Other specified hearing loss, right ear: Secondary | ICD-10-CM | POA: Diagnosis not present

## 2020-07-20 DIAGNOSIS — H9011 Conductive hearing loss, unilateral, right ear, with unrestricted hearing on the contralateral side: Secondary | ICD-10-CM | POA: Diagnosis not present

## 2020-07-20 DIAGNOSIS — H6981 Other specified disorders of Eustachian tube, right ear: Secondary | ICD-10-CM | POA: Diagnosis not present

## 2020-10-23 DIAGNOSIS — Z0000 Encounter for general adult medical examination without abnormal findings: Secondary | ICD-10-CM | POA: Diagnosis not present

## 2020-10-23 DIAGNOSIS — Z136 Encounter for screening for cardiovascular disorders: Secondary | ICD-10-CM | POA: Diagnosis not present

## 2020-10-23 DIAGNOSIS — Z13220 Encounter for screening for lipoid disorders: Secondary | ICD-10-CM | POA: Diagnosis not present

## 2020-10-23 DIAGNOSIS — E291 Testicular hypofunction: Secondary | ICD-10-CM | POA: Diagnosis not present

## 2020-12-20 ENCOUNTER — Ambulatory Visit
Admission: EM | Admit: 2020-12-20 | Discharge: 2020-12-20 | Disposition: A | Discharge status: Home / Self Care | Payer: BC Managed Care – PPO

## 2020-12-20 DIAGNOSIS — J069 Acute upper respiratory infection, unspecified: Secondary | ICD-10-CM

## 2020-12-20 MED ORDER — PREDNISONE 20 MG PO TABS: 20.0000 mg | ORAL_TABLET | Freq: Every day | ORAL | 0 refills | Status: AC

## 2020-12-20 MED ORDER — AZITHROMYCIN 250 MG PO TABS: ORAL_TABLET | ORAL | 0 refills | Status: AC

## 2020-12-20 MED ORDER — PROMETHAZINE-DM 6.25-15 MG/5ML PO SYRP: 5.0000 mL | ORAL_SOLUTION | Freq: Four times a day (QID) | ORAL | 0 refills | Status: AC | PRN

## 2020-12-20 NOTE — ED Triage Notes (Signed)
Patient presents to Urgent Care with complaints of sore throat and congestion x 1 week. Treating symptoms with OTC meds with no improvement. Negative covid test.  Denies fever.

## 2020-12-20 NOTE — ED Provider Notes (Signed)
[facility]    CSN: [id number] Arrival date & time: 12/20/20  0944      History   Chief Complaint Chief Complaint  Patient presents with   Sore Throat    HPI Dylan Mclaughlin is a 35 y.o. male.   HPI Patient presents today with 1 week of sore throat and congestion.  Patient reports negative home COVID test.  Denies any prior history of any chronic respiratory illness.  Denies fever.  Denies sick contacts.  Coughing and  facial pressure  Covid test negative  History reviewed. No pertinent past medical history.  There are no problems to display for this patient.   History reviewed. No pertinent surgical history.     Home Medications    Prior to Admission medications   Medication Sig Start Date End Date Taking? Authorizing Provider  azithromycin (ZITHROMAX) 250 MG tablet Take 2 tabs PO x 1 dose, then 1 tab PO QD x 4 days 12/20/20  Yes Bing Neighbors, FNP  predniSONE (DELTASONE) 20 MG tablet Take 1 tablet (20 mg total) by mouth daily with breakfast for 3 days. 12/20/20 12/23/20 Yes Bing Neighbors, FNP  promethazine-dextromethorphan (PROMETHAZINE-DM) 6.25-15 MG/5ML syrup Take 5 mLs by mouth 4 (four) times daily as needed for cough. 12/20/20  Yes Bing Neighbors, FNP  testosterone cypionate (DEPOTESTOSTERONE CYPIONATE) 200 MG/ML injection INJECT EVERY 1 WEEK 09/11/19  Yes [provider]    Family History History reviewed. No pertinent family history.  Social History Social History   Tobacco Use   Smoking status: Never   Smokeless tobacco: Never     Allergies   Penicillins   Review of Systems Review of Systems Pertinent negatives listed in HPI  Physical Exam Triage Vital Signs ED Triage Vitals  Enc Vitals Group     BP 12/20/20 1001 130/85     Pulse Rate 12/20/20 1001 75     Resp 12/20/20 1001 16     Temp 12/20/20 1001 98.4 F (36.9 C)     Temp Source 12/20/20 1001 Oral     SpO2 12/20/20 1001 96 %     Weight --       Height --      Head Circumference --      Peak Flow --      Pain Score 12/20/20 1000 0     Pain Loc --      Pain Edu? --      Excl. in GC? --    No data found.  Updated Vital Signs BP 130/85 (BP Location: Left Arm)   Pulse 75   Temp 98.4 F (36.9 C) (Oral)   Resp 16   SpO2 96%   Visual Acuity Right Eye Distance:   Left Eye Distance:   Bilateral Distance:    Right Eye Near:   Left Eye Near:    Bilateral Near:     Physical Exam  General Appearance:    Alert, cooperative, no distress  HENT:   Normocephalic, ears normal, nares mucosal edema with congestion, rhinorrhea, oropharynx  erythematous w/o exudate   Eyes:    PERRL, conjunctiva/corneas clear, EOM's intact       Lungs:     Clear to auscultation bilaterally, respirations unlabored  Heart:    Regular rate and rhythm  Neurologic:   Awake, alert, oriented x 3. No apparent focal neurological           defect.      UC Treatments / Results  Labs (all labs ordered are listed, but only abnormal results are displayed) Labs Reviewed - No data to display  EKG   Radiology No results found.  Procedures Procedures (including critical care time)  Medications Ordered in UC Medications - No data to display  Initial Impression / Assessment and Plan / UC Course  I have reviewed the triage vital signs and the nursing notes.  Pertinent labs & imaging results that were available during my care of the patient were reviewed by me and considered in my medical decision making (see chart for details).    Acute upper respiratory infection Treatment per discharge medication orders. Return precautions given if symptoms worsen or do not improve.   Final Clinical Impressions(s) / UC Diagnoses   Final diagnoses:  Acute upper respiratory infection   Discharge Instructions   None    ED Prescriptions     Medication Sig Dispense Auth. Provider   predniSONE (DELTASONE) 20 MG tablet Take 1 tablet (20 mg total) by mouth daily with  breakfast for 3 days. 3 tablet Bing Neighbors, FNP   azithromycin (ZITHROMAX) 250 MG tablet Take 2 tabs PO x 1 dose, then 1 tab PO QD x 4 days 6 tablet Bing Neighbors, FNP   promethazine-dextromethorphan (PROMETHAZINE-DM) 6.25-15 MG/5ML syrup Take 5 mLs by mouth 4 (four) times daily as needed for cough. 140 mL Bing Neighbors, FNP      PDMP not reviewed this encounter.   Bing Neighbors, FNP 12/20/20 1036
